# Patient Record
Sex: Female | Born: 1965
Health system: Southern US, Community
[De-identification: ages and names within clinical notes are randomized; demographics above are authoritative.]

## PROBLEM LIST (undated history)

## (undated) HISTORY — PX: REDUCTION MAMMAPLASTY: SUR839

---

## 1997-12-07 ENCOUNTER — Other Ambulatory Visit: Admission: RE | Admit: 1997-12-07 | Discharge: 1997-12-07 | Payer: Self-pay | Admitting: Gynecology

## 1999-06-07 ENCOUNTER — Other Ambulatory Visit: Admission: RE | Admit: 1999-06-07 | Discharge: 1999-06-07 | Payer: Self-pay | Admitting: Obstetrics and Gynecology

## 1999-06-22 ENCOUNTER — Encounter: Payer: Self-pay | Admitting: Obstetrics and Gynecology

## 1999-06-22 ENCOUNTER — Ambulatory Visit (HOSPITAL_COMMUNITY): Admission: RE | Admit: 1999-06-22 | Discharge: 1999-06-22 | Payer: Self-pay | Admitting: Obstetrics and Gynecology

## 1999-09-21 ENCOUNTER — Ambulatory Visit (HOSPITAL_COMMUNITY): Admission: RE | Admit: 1999-09-21 | Discharge: 1999-09-21 | Payer: Self-pay | Admitting: Obstetrics & Gynecology

## 1999-09-21 ENCOUNTER — Encounter: Payer: Self-pay | Admitting: Obstetrics & Gynecology

## 1999-11-14 ENCOUNTER — Encounter (INDEPENDENT_AMBULATORY_CARE_PROVIDER_SITE_OTHER): Payer: Self-pay

## 1999-11-14 ENCOUNTER — Inpatient Hospital Stay (HOSPITAL_COMMUNITY): Admission: AD | Admit: 1999-11-14 | Discharge: 1999-11-17 | Payer: Self-pay | Admitting: *Deleted

## 2004-02-03 ENCOUNTER — Ambulatory Visit (HOSPITAL_COMMUNITY): Admission: RE | Admit: 2004-02-03 | Discharge: 2004-02-03 | Payer: Self-pay | Admitting: Family Medicine

## 2004-02-17 ENCOUNTER — Ambulatory Visit: Payer: Self-pay | Admitting: Nurse Practitioner

## 2004-02-29 ENCOUNTER — Ambulatory Visit: Payer: Self-pay | Admitting: Nurse Practitioner

## 2004-08-24 ENCOUNTER — Ambulatory Visit: Payer: Self-pay | Admitting: Nurse Practitioner

## 2006-06-10 ENCOUNTER — Other Ambulatory Visit: Admission: RE | Admit: 2006-06-10 | Discharge: 2006-06-10 | Payer: Self-pay | Admitting: Family Medicine

## 2007-06-16 ENCOUNTER — Other Ambulatory Visit: Admission: RE | Admit: 2007-06-16 | Discharge: 2007-06-16 | Payer: Self-pay | Admitting: Family Medicine

## 2007-10-28 ENCOUNTER — Ambulatory Visit (HOSPITAL_COMMUNITY): Admission: RE | Admit: 2007-10-28 | Discharge: 2007-10-28 | Payer: Self-pay | Admitting: Internal Medicine

## 2008-10-27 ENCOUNTER — Other Ambulatory Visit: Admission: RE | Admit: 2008-10-27 | Discharge: 2008-10-27 | Payer: Self-pay | Admitting: Family Medicine

## 2009-10-31 ENCOUNTER — Other Ambulatory Visit: Admission: RE | Admit: 2009-10-31 | Discharge: 2009-10-31 | Payer: Self-pay | Admitting: Family Medicine

## 2010-01-04 ENCOUNTER — Ambulatory Visit (HOSPITAL_COMMUNITY): Admission: RE | Admit: 2010-01-04 | Discharge: 2010-01-04 | Payer: Self-pay | Admitting: Family Medicine

## 2010-07-02 ENCOUNTER — Encounter: Payer: Self-pay | Admitting: Occupational Therapy

## 2010-10-27 NOTE — Discharge Summary (Signed)
[facility]  Patient:    Catherine Mullins, Catherine Mullins                          MRN: [id number] Adm. Date:  [id number] Disc. Date: [id number] Attending:  Pleas Koch Dictator:   Catherine Mullins, C.N.M.                           Discharge Summary  ADMISSION DIAGNOSES:          1. Intrauterine pregnancy at term.                               2. Unstable fetal lie following successful external                                  fetal version.                               3. Desires tubal sterilization.  DISCHARGE DIAGNOSES:          1. Intrauterine pregnancy at term.                               2. Spontaneous vaginal delivery.                               3. Sterilization done.  PROCEDURE:                    1) External version from breech presentation. 2) Induction of labor.  3) Artificial rupture of membranes.  4) Pitocin augmentation.  5) Normal spontaneous vaginal delivery.  6) Postpartum tubal ligation.  HOSPITAL COURSE:              Catherine Mullins is a 45 year old, gravida 4, para 2-0-1-2, at 40-2/7 weeks who was seen in maternity admissions unit on November 14, 1999, for external version.  This was successful and she was therefore subsequently admitted for induction secondary to late term and unstable fetal lie.  At the time of induction, cervix was external os 3 cm, internal os fingertip, 50%, and vertex per ultrasound and the vertex was out of the pelvis.  Pregnancy had been remarkable for:  1) Advanced maternal age with amniocentesis declined.  2) Desiring tubal sterilization.  3) Short stature.  Pitocin was begun to induce labor and to help with descent of the vertex.  It was discontinued during the night to enable Cytotec, but contractions continued precluding use of Cytotec.  On the morning of November 15, 1999, the cervix was 3 to 4 cm, 50%, and vertex was ballotable at -3 station.  Pitocin was restarted to enhance descent.  Catherine Mullins, M.D. was  consulted at approximately 2 p.m. when the cervix was 4 cm, 75%, with vertex at -3.  Artificial rupture of membranes was accomplished by Catherine Mullins, M.D. with meconium stained fluid noted.  A pressure catheter was placed and an amnioinfusion was begun.  Fetal head descended to a -1 after artificial rupture of membranes.  The patient then progressed rapidly to a spontaneous vaginal delivery of a viable female, weight 7 pounds 0  ounces,  Apgars 8 and 9, by the name of Catherine Mullins.  Nursery team was present at delivery. There was a nuchal cord x 2 that was reduced easily.  There was a small first degree perineal laceration noted.  The infant was taken to the fullterm nursery. Mother was taken to the recovery room in good condition.  She had elected a tubal sterilization which was performed on postpartum day #1 by Catherine Mullins, M.D. without complications.  Findings were normal tubes and ovaries.  This was one under general anesthesia.  The patient tolerated the procedure well and was taken to the recovery room in good condition by postpartum day #2.  On postoperative ay #1, the patient was doing well.  Her hemoglobin was 10.4 days #1 postpartum. She was up ad lib.  She taking Motrin for pain medication.  She was bottlefeeding. She was deemed to have received the full benefit of her hospital stay and was discharged home on November 17, 1999.  DISCHARGE INSTRUCTIONS:       Per [facility] and Gynecology handout.  DISCHARGE MEDICATIONS:        1. Motrin 600 mg p.o. q.6h. p.r.n. pain.                               2. Tylox one to two p.o. q.3-4h. p.r.n. pain.                               3. Prenatal vitamins one p.o. q.d.  FOLLOW-UP:                    Discharge follow-up will occur in six weeks at [facility] and Gynecology. DD:  11/17/99 TD:  11/20/99 Job: [id number]

## 2010-10-27 NOTE — H&P (Signed)
[facility]  Patient:    Catherine Mullins, Catherine Mullins                          MRN: [id number] Adm. Date:  [id number] Attending:  Pleas Koch Dictator:   Vance Gather Duplantis, C.N.M.                         History and Physical  HISTORY OF PRESENT ILLNESS:   Ms. Catherine Mullins is a 45 year old single [place] female, gravida 4, para 2-0-1-2, at 40-2/7 weeks by LMP who presents for external version today secondary to being breech.  She had a successful external version and is now vertex and due to her history of unstable lie, it is recommended for induction of labor today.  She agrees with induction.  She denies any regular frequent uterine contractions, leaking, bleeding, headache, or visual disturbances. She reports positive fetal movement.  Her pregnancy has been followed at [facility] and Gynecology by the certified nurse midwife service and has been complicated or at risk for history of advanced maternal age declining amniocentesis and short stature.  She also desires postpartum bilateral tubal ligation with this hospital admission.  Her Group B Strep is negative.  OB/GYN HISTORY:               She is a gravida 4, para 2-0-1-2, who has delivered a viable female infant in May of 1996 who weighed 6 pounds 11 ounces at [redacted] weeks gestation following a 24-hour labor.  In September of 1997 she delivered a viable female infant who weighed 6 pounds 0 ounces at [redacted] weeks gestation following a four-hour labor also with no complications.  ALLERGIES:                    No known drug allergies.  PAST MEDICAL HISTORY:         She reports having had the usual childhood diseases. She reports occasional urinary tract infections and her only hospitalizations have been for childbirth.  FAMILY HISTORY:               Significant for both parents with hypertension.  GENETIC HISTORY:              Negative though she reports a nephew born with several anomalies who  survived one month.  SOCIAL HISTORY:               She is single.  The father of the baby is not very involved with her.  She does have her own father here who is involved and supportive.  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  She is from [place].  PRENATAL LABORATORY DATA:     Blood type O positive, antibody screen is negative, sickle cell trait is negative, syphilis is nonreactive, rubella is positive, hepatitis B surface antigen is negative, HIV nonreactive, GC and Chlamydia are oth negative.  Pap is within normal limits.  Her one-hour Glucola is 107 and a maternal serum alpha fetoprotein was within normal range.  Her 36-week Beta Strep was negative.  PHYSICAL EXAMINATION:  VITAL SIGNS:                  Stable.  She is afebrile.  HEENT:                        Grossly within normal limits.  HEART:  Regular rate and rhythm.  CHEST:                        Clear.  BREASTS:                      Soft and nontender.  ABDOMEN:                      Gravid with contractions every 7 to 10 minutes. Her fetal heart rate is reactive and reassuring following version.  PELVIC:                       3 cm external os, fingertip internal os, 50%, soft, vertex out of the pelvis.  EXTREMITIES:                  Within normal limits.  ASSESSMENT:                   1. Intrauterine pregnancy at term.                               2. Successful version today.                               3. Unstable lie.                               4. Multiparity, desires bilateral tubal ligation.                               5. Group B Strep negative.  PLAN:                         To admit to labor and delivery at [facility] [place] to proceed with induction of labor and Georgina Peer, M.D. is aware of the patients admission and agrees with plan and the patient will follow with  certified nurse midwife service. DD:  11/14/99 TD:  11/14/99 Job:  [id number]

## 2010-10-27 NOTE — Op Note (Signed)
[facility]  Patient:    Catherine Mullins, Catherine Mullins                          MRN: [id number] Proc. Date: 11/16/99 Adm. Date:  [id number] Disc. Date: [id number] Attending:  Pleas Koch                           Operative Report  PREOPERATIVE DIAGNOSIS:       Multiparity, desiring permanent                               sterilization.  POSTOPERATIVE DIAGNOSIS:      Multiparity, desiring permanent                               sterilization.  OPERATION:                    Postpartum bilateral tubal ligation.  SURGEON:                      Cecilio Asper, M.D.  ASSISTANT:  ANESTHESIA:                   General  ESTIMATED BLOOD LOSS:         Minimal  FINDINGS:                     Normal tubes bilaterally.  COMPLICATIONS:                None.  INDICATIONS:                  The patient is a 45 year old, Gravida 4, Para 2-0-1-2, status post spontaneous vaginal delivery desiring permanent sterilization.  She understands the procedure is to be considered permanent and irreversible.  She understands the small risk of tubal failure and should failure occur there is an increased risk of ectopic pregnancy.  The patients department of medical assistance papers are current.  She has therefore consented.  DESCRIPTION OF PROCEDURE:     After adequate level of general anesthesia was obtained, the patient was prepped and draped in a sterile fashion. An infraumbilical incision was made down to the subcutaneous fat to the fascia. The fascia was elevated with hemostats and incised with the Mayo scissors. Parietal peritoneum was grasped with hemostats and incised with the Mayo scissors.  [employer] retractors were placed inside of the incision. The right fallopian tube was identified, followed out to its fimbriated end, elevated with a Babcock. It was doubly tied with 0 plain.  A portion was cut and sent to pathology.  The left fallopian tube was identified,  followed out to its fimbriated end, elevated with a Babcock and doubly tied with 0 plain.  A portion of this tube was also sent to pathology. Hemostasis was assured. Parietal peritoneum and fascia were reapproximated with a running suture of 2-0 Vicryl.  Skin was reapproximated with a subcuticular of 4-0 Vicryl, 10 cc of 0.5% Marcaine with epinephrine was injected into the incision. Bandage was applied.  The patient tolerated the procedure well.  She was taken to the recovery room in stable condition. DD:  11/16/99 TD:  11/20/99 Job: [id number]

## 2011-10-19 ENCOUNTER — Other Ambulatory Visit: Payer: Self-pay | Admitting: Family Medicine

## 2011-10-19 ENCOUNTER — Other Ambulatory Visit (HOSPITAL_COMMUNITY)
Admission: RE | Admit: 2011-10-19 | Discharge: 2011-10-19 | Disposition: A | Discharge status: Home / Self Care | Payer: Self-pay | Source: Ambulatory Visit | Attending: Family Medicine | Admitting: Family Medicine

## 2011-10-19 DIAGNOSIS — Z01419 Encounter for gynecological examination (general) (routine) without abnormal findings: Secondary | ICD-10-CM | POA: Insufficient documentation

## 2012-09-05 ENCOUNTER — Other Ambulatory Visit (HOSPITAL_COMMUNITY): Payer: Self-pay | Admitting: Family Medicine

## 2012-09-05 DIAGNOSIS — Z1231 Encounter for screening mammogram for malignant neoplasm of breast: Secondary | ICD-10-CM

## 2012-09-15 ENCOUNTER — Ambulatory Visit (HOSPITAL_COMMUNITY)
Admission: RE | Admit: 2012-09-15 | Discharge: 2012-09-15 | Disposition: A | Discharge status: Home / Self Care | Payer: BC Managed Care – PPO | Source: Ambulatory Visit | Attending: Family Medicine | Admitting: Family Medicine

## 2012-09-15 DIAGNOSIS — Z1231 Encounter for screening mammogram for malignant neoplasm of breast: Secondary | ICD-10-CM

## 2012-11-14 ENCOUNTER — Other Ambulatory Visit (HOSPITAL_COMMUNITY)
Admission: RE | Admit: 2012-11-14 | Discharge: 2012-11-14 | Disposition: A | Discharge status: Home / Self Care | Payer: BC Managed Care – PPO | Source: Ambulatory Visit | Attending: Family Medicine | Admitting: Family Medicine

## 2012-11-14 ENCOUNTER — Other Ambulatory Visit: Payer: Self-pay | Admitting: Family Medicine

## 2012-11-14 DIAGNOSIS — Z01419 Encounter for gynecological examination (general) (routine) without abnormal findings: Secondary | ICD-10-CM | POA: Insufficient documentation

## 2013-11-03 ENCOUNTER — Other Ambulatory Visit (HOSPITAL_COMMUNITY): Payer: Self-pay | Admitting: Family Medicine

## 2013-11-03 DIAGNOSIS — Z1231 Encounter for screening mammogram for malignant neoplasm of breast: Secondary | ICD-10-CM

## 2013-11-12 ENCOUNTER — Ambulatory Visit (HOSPITAL_COMMUNITY): Payer: BC Managed Care – PPO

## 2013-12-17 ENCOUNTER — Ambulatory Visit (HOSPITAL_COMMUNITY)
Admission: RE | Admit: 2013-12-17 | Discharge: 2013-12-17 | Disposition: A | Discharge status: Home / Self Care | Payer: 59 | Source: Ambulatory Visit | Attending: Family Medicine | Admitting: Family Medicine

## 2013-12-17 DIAGNOSIS — Z1231 Encounter for screening mammogram for malignant neoplasm of breast: Secondary | ICD-10-CM

## 2013-12-17 DIAGNOSIS — R928 Other abnormal and inconclusive findings on diagnostic imaging of breast: Secondary | ICD-10-CM | POA: Insufficient documentation

## 2013-12-18 ENCOUNTER — Other Ambulatory Visit: Payer: Self-pay | Admitting: Family Medicine

## 2013-12-18 DIAGNOSIS — R928 Other abnormal and inconclusive findings on diagnostic imaging of breast: Secondary | ICD-10-CM

## 2014-01-05 ENCOUNTER — Ambulatory Visit
Admission: RE | Admit: 2014-01-05 | Discharge: 2014-01-05 | Disposition: A | Discharge status: Home / Self Care | Payer: 59 | Source: Ambulatory Visit | Attending: Family Medicine | Admitting: Family Medicine

## 2014-01-05 ENCOUNTER — Encounter (INDEPENDENT_AMBULATORY_CARE_PROVIDER_SITE_OTHER): Payer: Self-pay

## 2014-01-05 DIAGNOSIS — R928 Other abnormal and inconclusive findings on diagnostic imaging of breast: Secondary | ICD-10-CM

## 2014-06-16 ENCOUNTER — Other Ambulatory Visit (HOSPITAL_COMMUNITY)
Admission: RE | Admit: 2014-06-16 | Discharge: 2014-06-16 | Disposition: A | Discharge status: Home / Self Care | Payer: 59 | Source: Ambulatory Visit | Attending: Family Medicine | Admitting: Family Medicine

## 2014-06-16 ENCOUNTER — Other Ambulatory Visit: Payer: Self-pay | Admitting: Family Medicine

## 2014-06-16 DIAGNOSIS — Z01419 Encounter for gynecological examination (general) (routine) without abnormal findings: Secondary | ICD-10-CM | POA: Insufficient documentation

## 2014-06-18 LAB — CYTOLOGY - PAP

## 2014-07-26 ENCOUNTER — Other Ambulatory Visit: Payer: Self-pay | Admitting: Gastroenterology

## 2014-12-07 ENCOUNTER — Other Ambulatory Visit (HOSPITAL_COMMUNITY): Payer: Self-pay | Admitting: Family Medicine

## 2014-12-07 DIAGNOSIS — Z1231 Encounter for screening mammogram for malignant neoplasm of breast: Secondary | ICD-10-CM

## 2015-01-11 ENCOUNTER — Ambulatory Visit (HOSPITAL_COMMUNITY)
Admission: RE | Admit: 2015-01-11 | Discharge: 2015-01-11 | Disposition: A | Discharge status: Home / Self Care | Payer: 59 | Source: Ambulatory Visit | Attending: Family Medicine | Admitting: Family Medicine

## 2015-01-11 DIAGNOSIS — Z1231 Encounter for screening mammogram for malignant neoplasm of breast: Secondary | ICD-10-CM | POA: Diagnosis not present

## 2015-09-14 ENCOUNTER — Other Ambulatory Visit: Payer: Self-pay | Admitting: Family Medicine

## 2015-09-14 ENCOUNTER — Other Ambulatory Visit (HOSPITAL_COMMUNITY)
Admission: RE | Admit: 2015-09-14 | Discharge: 2015-09-14 | Disposition: A | Discharge status: Home / Self Care | Payer: BLUE CROSS/BLUE SHIELD | Source: Ambulatory Visit | Attending: Family Medicine | Admitting: Family Medicine

## 2015-09-14 DIAGNOSIS — Z01419 Encounter for gynecological examination (general) (routine) without abnormal findings: Secondary | ICD-10-CM | POA: Diagnosis not present

## 2015-09-15 LAB — CYTOLOGY - PAP

## 2015-12-27 ENCOUNTER — Other Ambulatory Visit: Payer: Self-pay | Admitting: Family Medicine

## 2015-12-27 DIAGNOSIS — Z1231 Encounter for screening mammogram for malignant neoplasm of breast: Secondary | ICD-10-CM

## 2016-01-17 ENCOUNTER — Other Ambulatory Visit: Payer: Self-pay | Admitting: Family Medicine

## 2016-01-17 ENCOUNTER — Ambulatory Visit
Admission: RE | Admit: 2016-01-17 | Discharge: 2016-01-17 | Disposition: A | Discharge status: Home / Self Care | Payer: BLUE CROSS/BLUE SHIELD | Source: Ambulatory Visit | Attending: Family Medicine | Admitting: Family Medicine

## 2016-01-17 DIAGNOSIS — Z1231 Encounter for screening mammogram for malignant neoplasm of breast: Secondary | ICD-10-CM

## 2016-01-17 DIAGNOSIS — R928 Other abnormal and inconclusive findings on diagnostic imaging of breast: Secondary | ICD-10-CM

## 2016-01-25 ENCOUNTER — Ambulatory Visit
Admission: RE | Admit: 2016-01-25 | Discharge: 2016-01-25 | Disposition: A | Discharge status: Home / Self Care | Payer: BLUE CROSS/BLUE SHIELD | Source: Ambulatory Visit | Attending: Family Medicine | Admitting: Family Medicine

## 2016-01-25 ENCOUNTER — Other Ambulatory Visit: Payer: Self-pay | Admitting: Family Medicine

## 2016-01-25 DIAGNOSIS — R928 Other abnormal and inconclusive findings on diagnostic imaging of breast: Secondary | ICD-10-CM

## 2016-09-03 ENCOUNTER — Other Ambulatory Visit: Payer: Self-pay | Admitting: Family Medicine

## 2016-09-03 ENCOUNTER — Other Ambulatory Visit (HOSPITAL_COMMUNITY)
Admission: RE | Admit: 2016-09-03 | Discharge: 2016-09-03 | Disposition: A | Discharge status: Home / Self Care | Payer: BLUE CROSS/BLUE SHIELD | Source: Ambulatory Visit | Attending: Family Medicine | Admitting: Family Medicine

## 2016-09-03 DIAGNOSIS — Z01411 Encounter for gynecological examination (general) (routine) with abnormal findings: Secondary | ICD-10-CM | POA: Diagnosis not present

## 2016-09-05 LAB — CYTOLOGY - PAP: DIAGNOSIS: NEGATIVE

## 2016-12-18 ENCOUNTER — Other Ambulatory Visit: Payer: Self-pay | Admitting: Family Medicine

## 2016-12-18 DIAGNOSIS — Z1231 Encounter for screening mammogram for malignant neoplasm of breast: Secondary | ICD-10-CM

## 2017-01-17 ENCOUNTER — Ambulatory Visit
Admission: RE | Admit: 2017-01-17 | Discharge: 2017-01-17 | Disposition: A | Discharge status: Home / Self Care | Payer: BLUE CROSS/BLUE SHIELD | Source: Ambulatory Visit | Attending: Family Medicine | Admitting: Family Medicine

## 2017-01-17 DIAGNOSIS — Z1231 Encounter for screening mammogram for malignant neoplasm of breast: Secondary | ICD-10-CM

## 2017-09-04 ENCOUNTER — Other Ambulatory Visit: Payer: Self-pay | Admitting: Family Medicine

## 2017-09-04 ENCOUNTER — Other Ambulatory Visit (HOSPITAL_COMMUNITY)
Admission: RE | Admit: 2017-09-04 | Discharge: 2017-09-04 | Disposition: A | Discharge status: Home / Self Care | Payer: BLUE CROSS/BLUE SHIELD | Source: Ambulatory Visit | Attending: Family Medicine | Admitting: Family Medicine

## 2017-09-04 DIAGNOSIS — Z01419 Encounter for gynecological examination (general) (routine) without abnormal findings: Secondary | ICD-10-CM | POA: Insufficient documentation

## 2017-09-05 LAB — CYTOLOGY - PAP: Diagnosis: NEGATIVE

## 2017-12-17 ENCOUNTER — Other Ambulatory Visit: Payer: Self-pay | Admitting: Family Medicine

## 2017-12-17 DIAGNOSIS — Z1231 Encounter for screening mammogram for malignant neoplasm of breast: Secondary | ICD-10-CM

## 2018-01-21 ENCOUNTER — Ambulatory Visit
Admission: RE | Admit: 2018-01-21 | Discharge: 2018-01-21 | Disposition: A | Discharge status: Home / Self Care | Payer: BLUE CROSS/BLUE SHIELD | Source: Ambulatory Visit | Attending: Family Medicine | Admitting: Family Medicine

## 2018-01-21 DIAGNOSIS — Z1231 Encounter for screening mammogram for malignant neoplasm of breast: Secondary | ICD-10-CM

## 2018-12-16 IMAGING — MG DIGITAL SCREENING BILATERAL MAMMOGRAM WITH CAD
4 series · 4 of 4 positions shown · non-contrast
Comparison: Previous exam(s).

CLINICAL DATA: Screening.

EXAM:
DIGITAL SCREENING BILATERAL MAMMOGRAM WITH CAD

[R MLO]
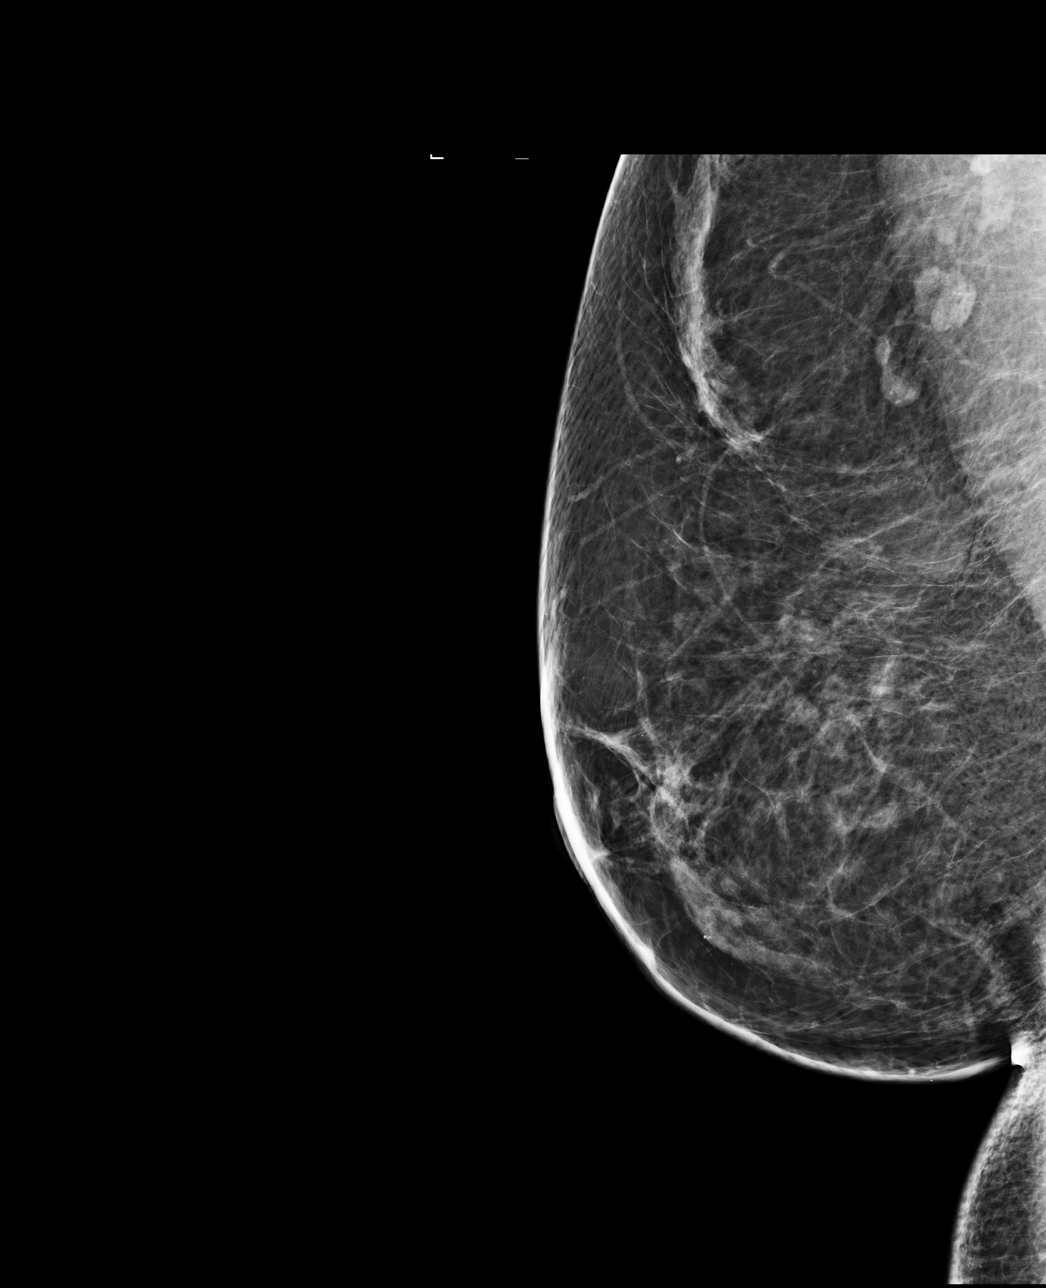

[L MLO]
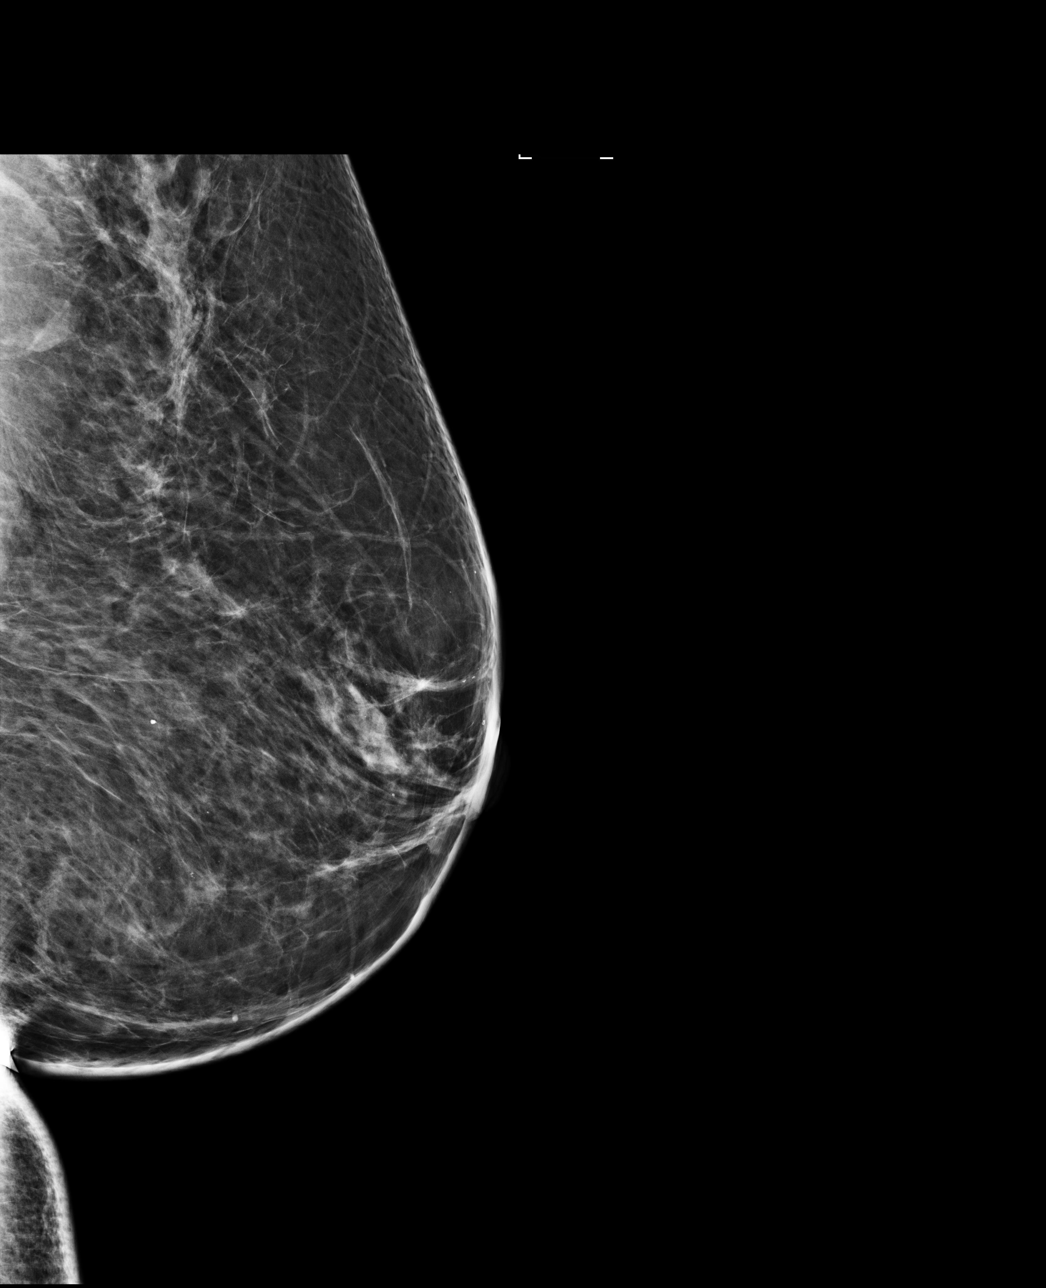

[R CC]
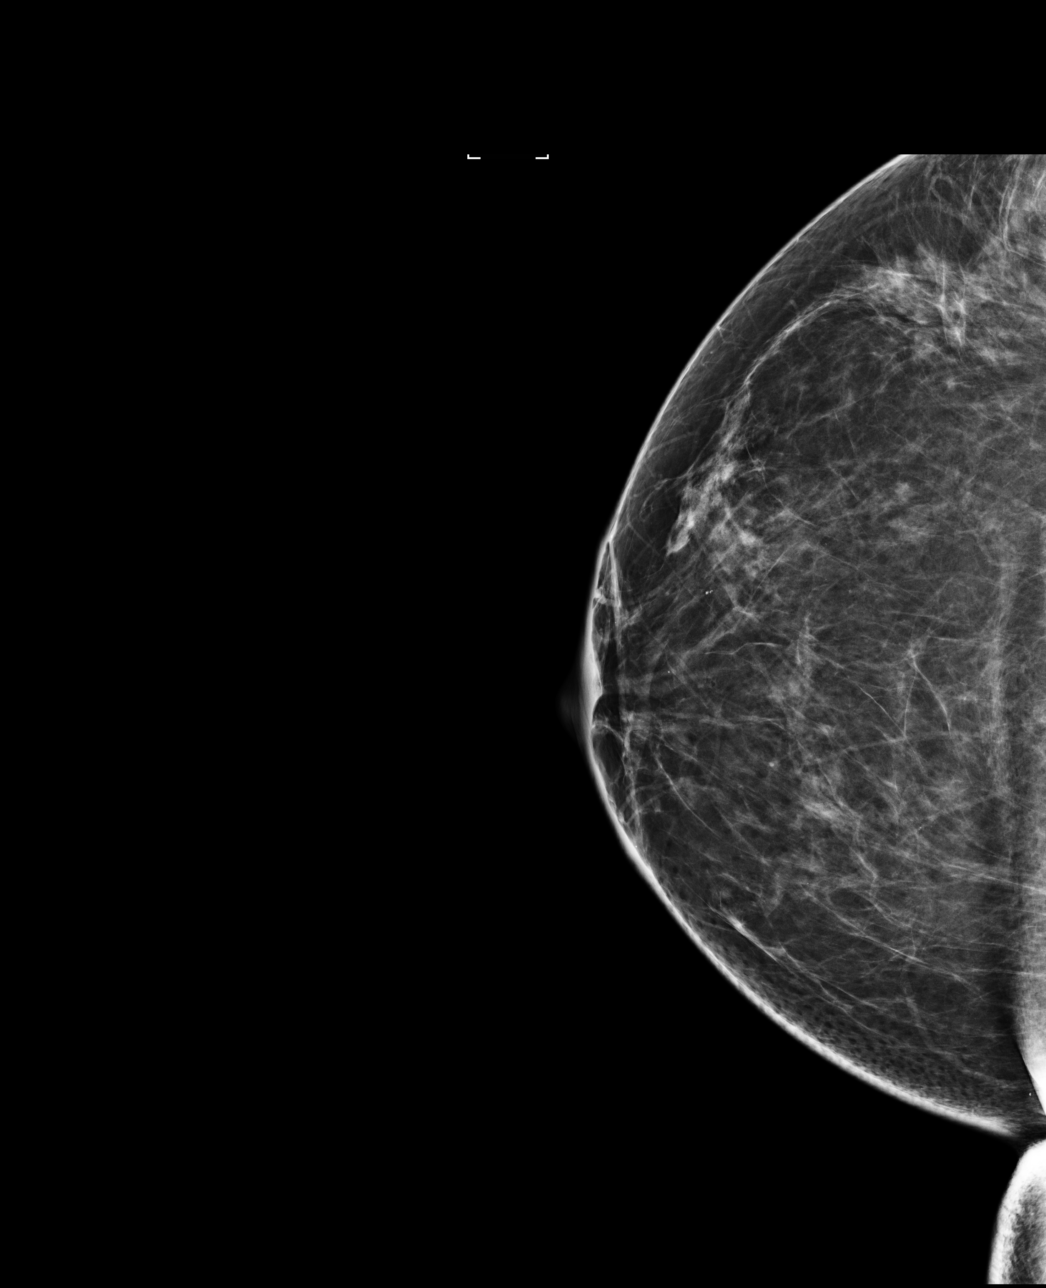

[L CC]
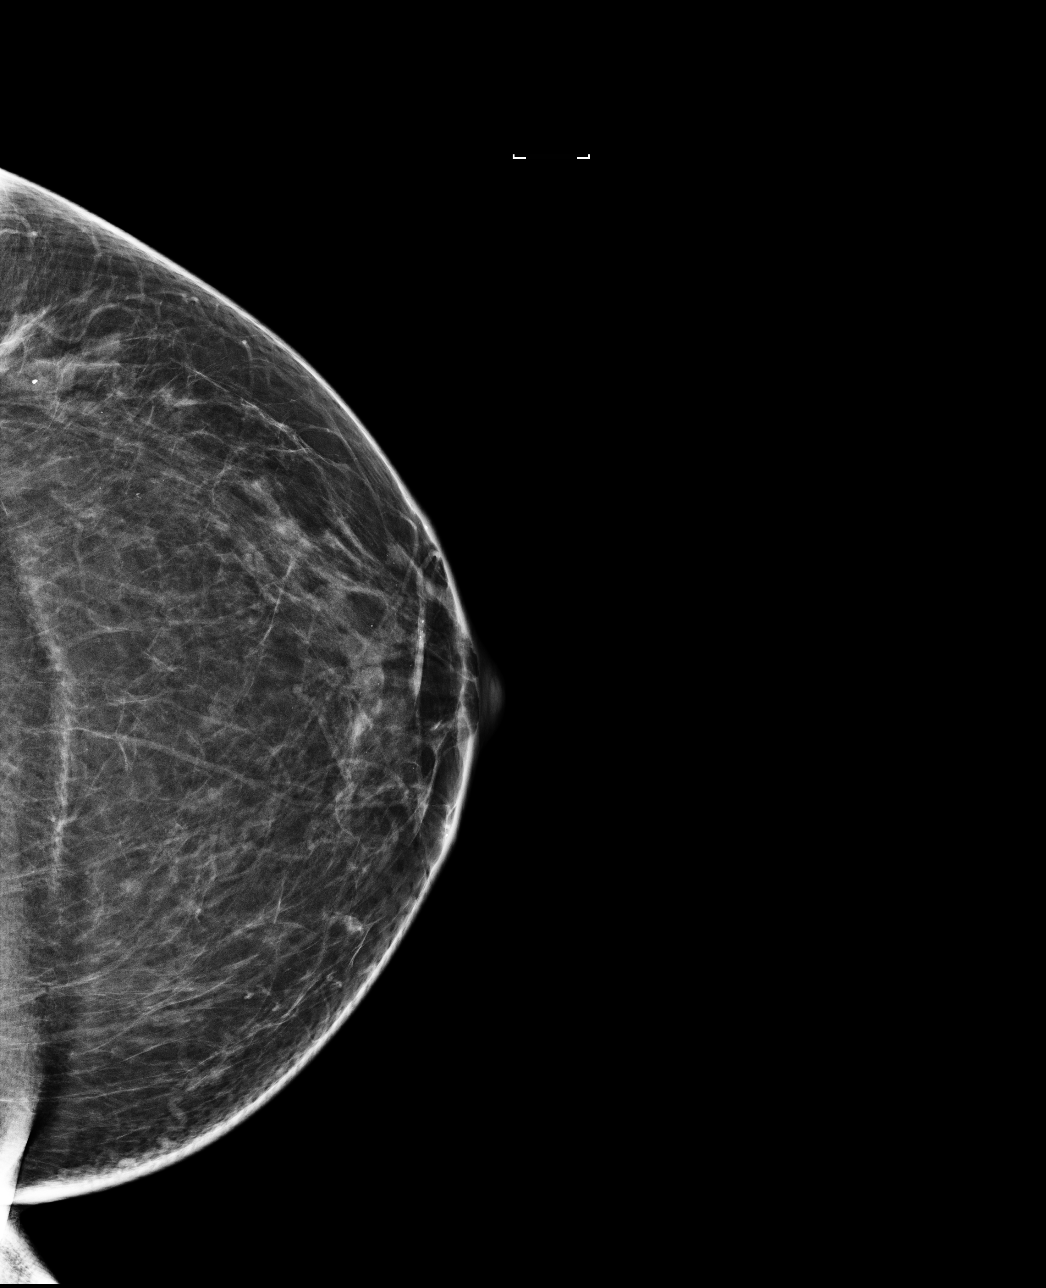

[4 of 4 positions shown; findings below may reference images not displayed]

ACR Breast Density Category b: There are scattered areas of
fibroglandular density.
FINDINGS: There are no findings suspicious for malignancy. Images were
processed with CAD.
IMPRESSION: No mammographic evidence of malignancy. A result letter of this
screening mammogram will be mailed directly to the patient.

RECOMMENDATION:
Screening mammogram in one year. (Code:AS-G-LCT)

BI-RADS CATEGORY  1: Negative.
